# Patient Record
Sex: Male | Born: 1990 | Race: Black or African American | Hispanic: No | Marital: Single | Smoking: Never smoker
Health system: Southern US, Community
[De-identification: ages and names within clinical notes are randomized; demographics above are authoritative.]

---
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
## 2013-02-26 ENCOUNTER — Emergency Department: Payer: Self-pay | Admitting: Emergency Medicine

## 2013-02-26 LAB — COMPREHENSIVE METABOLIC PANEL
Albumin: 4.7 g/dL (ref 3.4–5.0)
Alkaline Phosphatase: 72 U/L (ref 50–136)
BUN: 18 mg/dL (ref 7–18)
Bilirubin,Total: 0.8 mg/dL (ref 0.2–1.0)
Calcium, Total: 9.4 mg/dL (ref 8.5–10.1)
Chloride: 105 mmol/L (ref 98–107)
Co2: 28 mmol/L (ref 21–32)
EGFR (African American): >60
Glucose: 137 mg/dL — ABNORMAL HIGH (ref 65–99)
SGOT(AST): 26 U/L (ref 15–37)
Total Protein: 8.8 g/dL — ABNORMAL HIGH (ref 6.4–8.2)

## 2013-02-26 LAB — CBC
HCT: 48.8 % (ref 40.0–52.0)
MCH: 29.6 pg (ref 26.0–34.0)
MCHC: 32.6 g/dL (ref 32.0–36.0)
MCV: 91 fL (ref 80–100)
RDW: 13.1 % (ref 11.5–14.5)
WBC: 16.5 10*3/uL — ABNORMAL HIGH (ref 3.8–10.6)

## 2013-02-26 LAB — URINALYSIS, COMPLETE
Bacteria: NONE SEEN
Glucose,UR: NEGATIVE mg/dL (ref 0–75)
Leukocyte Esterase: NEGATIVE
Nitrite: NEGATIVE
Ph: 5 (ref 4.5–8.0)
Specific Gravity: 1.033 (ref 1.003–1.030)

## 2014-02-17 ENCOUNTER — Ambulatory Visit: Payer: Self-pay

## 2015-09-22 ENCOUNTER — Encounter: Payer: Self-pay | Admitting: Emergency Medicine

## 2015-09-22 ENCOUNTER — Emergency Department
Admission: EM | Admit: 2015-09-22 | Discharge: 2015-09-22 | Disposition: A | Discharge status: Home / Self Care | Payer: Managed Care, Other (non HMO) | Attending: Emergency Medicine | Admitting: Emergency Medicine

## 2015-09-22 DIAGNOSIS — M79605 Pain in left leg: Secondary | ICD-10-CM | POA: Diagnosis present

## 2015-09-22 DIAGNOSIS — L03116 Cellulitis of left lower limb: Secondary | ICD-10-CM | POA: Diagnosis not present

## 2015-09-22 MED ORDER — TRAMADOL HCL 50 MG PO TABS: 50.0000 mg | ORAL_TABLET | Freq: Four times a day (QID) | ORAL | Status: AC | PRN

## 2015-09-22 MED ORDER — SULFAMETHOXAZOLE-TRIMETHOPRIM 800-160 MG PO TABS: 1.0000 | ORAL_TABLET | Freq: Two times a day (BID) | ORAL | Status: AC

## 2015-09-22 NOTE — ED Notes (Signed)
Presents with possible spider bite to left lower leg

## 2015-09-22 NOTE — ED Provider Notes (Signed)
[facility] Emergency Department Provider Note ____________________________________________  Time seen: Approximately 3:27 PM  I have reviewed the triage vital signs and the nursing notes.   HISTORY  Chief Complaint Insect Bite   HPI James Huber is a 24 y.o. male who presents to the emergency department for evaluation of a possible insect bite to the left lower leg. He noticed it 2-3 days ago and is getting bigger and more painful.  History reviewed. No pertinent past medical history.  There are no active problems to display for this patient.   History reviewed. No pertinent past surgical history.  Current Outpatient Rx  Name  Route  Sig  Dispense  Refill  . sulfamethoxazole-trimethoprim (BACTRIM DS,SEPTRA DS) 800-160 MG tablet   Oral   Take 1 tablet by mouth 2 (two) times daily.   20 tablet   0   . traMADol (ULTRAM) 50 MG tablet   Oral   Take 1 tablet (50 mg total) by mouth every 6 (six) hours as needed.   9 tablet   0     Allergies Review of patient's allergies indicates no known allergies.  No family history on file.  Social History Social History  Substance Use Topics  . Smoking status: Never Smoker   . Smokeless tobacco: None  . Alcohol Use: Yes     Comment: occassional    Review of Systems   Constitutional: No fever/chills Eyes: No visual changes. ENT: No congestion or rhinorrhea Cardiovascular: Denies chest pain. Respiratory: Denies shortness of breath. Gastrointestinal: No abdominal pain.  No nausea, no vomiting.  No diarrhea.  No constipation. Genitourinary: Negative for dysuria. Musculoskeletal: Negative for back pain. Skin: Positive for lesion. Neurological: Negative for headaches, focal weakness or numbness.  10-point ROS otherwise negative.  ____________________________________________   PHYSICAL EXAM:  VITAL SIGNS: ED Triage Vitals  Enc Vitals Group     BP 09/22/15 0724 123/78 mmHg     Pulse Rate  09/22/15 0724 71     Resp 09/22/15 0724 19     Temp 09/22/15 0724 97.6 F (36.4 C)     Temp Source 09/22/15 0724 Oral     SpO2 09/22/15 0724 100 %     Weight 09/22/15 0724 135 lb (61.236 kg)     Height 09/22/15 0724  (1.575 m)     Head Cir --      Peak Flow --      Pain Score 09/22/15 0724 3     Pain Loc --      Pain Edu? --      Excl. in GC? --     Constitutional: Alert and oriented. Well appearing and in no acute distress. Eyes: Conjunctivae are normal. PERRL. EOMI. Head: Atraumatic. Nose: No congestion/rhinnorhea. Mouth/Throat: Mucous membranes are moist.  Oropharynx non-erythematous. No oral lesions. Neck: No stridor. Cardiovascular: Normal rate, regular rhythm.  Good peripheral circulation. Respiratory: Normal respiratory effort.  No retractions. Lungs CTAB. Gastrointestinal: Soft and nontender. No distention. No abdominal bruits.  Musculoskeletal: No lower extremity tenderness nor edema.  No joint effusions. Neurologic:  Normal speech and language. No gross focal neurologic deficits are appreciated. Speech is normal. No gait instability. Skin:  Indurated, non fluctuant, non draining, erythematous, raised area to the left posterior calf; Negative for petechiae.  Psychiatric: Mood and affect are normal. Speech and behavior are normal.  ____________________________________________   LABS (all labs ordered are listed, but only abnormal results are displayed)  Labs Reviewed - No data to display ____________________________________________  EKG   ____________________________________________  RADIOLOGY   ____________________________________________   PROCEDURES  Procedure(s) performed: None ____________________________________________   INITIAL IMPRESSION / ASSESSMENT AND PLAN / ED COURSE  Pertinent labs & imaging results that were available during my care of the patient were reviewed by me and considered in my medical decision making (see chart for  details).  Patient was advised to return to the ER in 2 days if not improving. He was advised to take the antibiotic until finished.  ____________________________________________   FINAL CLINICAL IMPRESSION(S) / ED DIAGNOSES  Final diagnoses:  Cellulitis of left lower extremity       Chinita Pester, FNP 09/22/15 1531  Jene Every, MD 09/22/15 [phone]

## 2015-09-22 NOTE — Discharge Instructions (Signed)

## 2020-06-16 ENCOUNTER — Other Ambulatory Visit: Payer: Self-pay

## 2020-06-16 ENCOUNTER — Emergency Department: Payer: Commercial Managed Care - PPO

## 2020-06-16 ENCOUNTER — Emergency Department
Admission: EM | Admit: 2020-06-16 | Discharge: 2020-06-17 | Disposition: A | Discharge status: Home / Self Care | Payer: Commercial Managed Care - PPO | Attending: Emergency Medicine | Admitting: Emergency Medicine

## 2020-06-16 ENCOUNTER — Encounter: Payer: Self-pay | Admitting: Emergency Medicine

## 2020-06-16 DIAGNOSIS — Y9239 Other specified sports and athletic area as the place of occurrence of the external cause: Secondary | ICD-10-CM | POA: Diagnosis not present

## 2020-06-16 DIAGNOSIS — Y9372 Activity, wrestling: Secondary | ICD-10-CM | POA: Diagnosis not present

## 2020-06-16 DIAGNOSIS — S5001XA Contusion of right elbow, initial encounter: Secondary | ICD-10-CM | POA: Diagnosis not present

## 2020-06-16 DIAGNOSIS — W19XXXA Unspecified fall, initial encounter: Secondary | ICD-10-CM

## 2020-06-16 DIAGNOSIS — S0990XA Unspecified injury of head, initial encounter: Secondary | ICD-10-CM | POA: Diagnosis present

## 2020-06-16 DIAGNOSIS — Y999 Unspecified external cause status: Secondary | ICD-10-CM | POA: Insufficient documentation

## 2020-06-16 DIAGNOSIS — S0101XA Laceration without foreign body of scalp, initial encounter: Secondary | ICD-10-CM | POA: Diagnosis not present

## 2020-06-16 DIAGNOSIS — W1789XA Other fall from one level to another, initial encounter: Secondary | ICD-10-CM | POA: Insufficient documentation

## 2020-06-16 NOTE — ED Notes (Signed)
D/w Dr. Mayford Knife, new orders received for imaging.

## 2020-06-16 NOTE — ED Notes (Signed)
Pt to CT and XR
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
## 2020-06-16 NOTE — ED Provider Notes (Signed)
[facility] Emergency Department Provider Note  ____________________________________________  Time seen: Approximately 11:09 PM  I have reviewed the triage vital signs and the nursing notes.   HISTORY  Chief Complaint Head Injury and Elbow Pain    HPI James Huber is a 29 y.o. male who presents the emergency department complaining of head injury, right elbow injury after a fall tonight.  Patient states that he was training in a wrestling ring.  Patient went to enter the ring.  Patient states that he typically places his hands on the top band, plates into the ring.  Patient's partner typically lowers the top as he enters the ring and attempted to help the patient by doing so.  When the bands swung back, it forced the patient backwards off the side of the ring and into the floor.  Patient landed awkwardly on his head and neck.  No loss of consciousness.  Patient remembers the entire sequence of events.  Patient did sustain a laceration to the left occipital skull.  Slight headache but no visual changes.  No neck pain.  Patient also landed awkwardly on the right elbow but has good range of motion and no loss of sensation to the right hand.  No other injuries or complaints.  No medication prior to arrival.         History reviewed. No pertinent past medical history.  There are no problems to display for this patient.   History reviewed. No pertinent surgical history.  Prior to Admission medications   Medication Sig Start Date End Date Taking? Authorizing Provider  sulfamethoxazole-trimethoprim (BACTRIM DS,SEPTRA DS) 800-160 MG tablet Take 1 tablet by mouth 2 (two) times daily. 09/22/15   Triplett, Cari B, FNP  traMADol (ULTRAM) 50 MG tablet Take 1 tablet (50 mg total) by mouth every 6 (six) hours as needed. 09/22/15   Chinita Pester, FNP    Allergies Patient has no known allergies.  History reviewed. No pertinent family history.  Social History Social  History   Tobacco Use  . Smoking status: Never Smoker  . Smokeless tobacco: Never Used  Vaping Use  . Vaping Use: Never used  Substance Use Topics  . Alcohol use: Yes    Comment: occassional  . Drug use: Not on file     Review of Systems  Constitutional: No fever/chills Eyes: No visual changes. No discharge ENT: No upper respiratory complaints. Cardiovascular: no chest pain. Respiratory: no cough. No SOB. Gastrointestinal: No abdominal pain.  No nausea, no vomiting.  No diarrhea.  No constipation. Musculoskeletal: Elbow injury.  Fall landing on head and neck Skin: Scalp laceration Neurological: Negative for headaches, focal weakness or numbness. 10-point ROS otherwise negative.  ____________________________________________   PHYSICAL EXAM:  VITAL SIGNS: ED Triage Vitals  Enc Vitals Group     BP 06/16/20 2210 (!) 144/78     Pulse Rate 06/16/20 2210 74     Resp 06/16/20 2210 18     Temp 06/16/20 2210 98.6 F (37 C)     Temp Source 06/16/20 2210 Oral     SpO2 06/16/20 2210 100 %     Weight 06/16/20 2211 145 lb (65.8 kg)     Height 06/16/20 2211 5' 2.5" (1.588 m)     Head Circumference --      Peak Flow --      Pain Score 06/16/20 2210 1     Pain Loc --      Pain Edu? --  Excl. in GC? --      Constitutional: Alert and oriented. Well appearing and in no acute distress. Eyes: Conjunctivae are normal. PERRL. EOMI. Head: Superficial laceration noted to the left occipital skull.  This measures approximately 1-1/2 cm.  No active bleeding but palpation reveals hematoma which does leak through this superficial laceration.  Area slightly tender palpation with no underlying palpable abnormality.  No other visible signs of trauma to the head or face.  No other tenderness to palpation.  No raccoon eyes, battle signs, serosanguineous wound drainage from the ears or nares. ENT:      Ears:       Nose: No congestion/rhinnorhea.      Mouth/Throat: Mucous membranes are moist.   Neck: No stridor.  No cervical spine tenderness to palpation  Cardiovascular: Normal rate, regular rhythm. Normal S1 and S2.  Good peripheral circulation. Respiratory: Normal respiratory effort without tachypnea or retractions. Lungs CTAB. Good air entry to the bases with no decreased or absent breath sounds. Musculoskeletal: Full range of motion to all extremities. No gross deformities appreciated.  Visualization of the right elbow reveals no visible signs of trauma.  Good range of motion.  Mild posterior tenderness to palpation with no palpable abnormality.  No ballottement.  Radial pulse and sensation intact distally. Neurologic:  Normal speech and language. No gross focal neurologic deficits are appreciated.  Cranial nerves II through XII grossly intact.  Negative Romberg's and pronator drift. Skin:  Skin is warm, dry and intact. No rash noted. Psychiatric: Mood and affect are normal. Speech and behavior are normal. Patient exhibits appropriate insight and judgement.   ____________________________________________   LABS (all labs ordered are listed, but only abnormal results are displayed)  Labs Reviewed - No data to display ____________________________________________  EKG   ____________________________________________  RADIOLOGY I personally viewed and evaluated these images as part of my medical decision making, as well as reviewing the written report by the radiologist.  DG Elbow Complete Right  Result Date: 06/16/2020 CLINICAL DATA:  Fall with pain and swelling EXAM: RIGHT ELBOW - COMPLETE 3+ VIEW COMPARISON:  None. FINDINGS: There is no evidence of fracture, dislocation, or joint effusion. There is no evidence of arthropathy or other focal bone abnormality. Soft tissues are unremarkable. IMPRESSION: Negative. Electronically Signed   By: Jasmine Pang M.D.   On: 06/16/2020 22:41   CT Head Wo Contrast  Result Date: 06/16/2020 CLINICAL DATA:  Head trauma fell and hit back of  head EXAM: CT HEAD WITHOUT CONTRAST TECHNIQUE: Contiguous axial images were obtained from the base of the skull through the vertex without intravenous contrast. COMPARISON:  Report 10/04/2019 FINDINGS: Brain: No evidence of acute infarction, hemorrhage, hydrocephalus, extra-axial collection or mass lesion/mass effect. Vascular: No hyperdense vessel or unexpected calcification. Skull: Normal. Negative for fracture or focal lesion. Sinuses/Orbits: No acute finding. Other: None IMPRESSION: Negative non contrasted CT appearance of the brain. Electronically Signed   By: Jasmine Pang M.D.   On: 06/16/2020 22:39   CT Cervical Spine Wo Contrast  Result Date: 06/16/2020 CLINICAL DATA:  Larey Seat and hit back of head EXAM: CT CERVICAL SPINE WITHOUT CONTRAST TECHNIQUE: Multidetector CT imaging of the cervical spine was performed without intravenous contrast. Multiplanar CT image reconstructions were also generated. COMPARISON:  None. FINDINGS: Alignment: Straightening of the cervical spine. No subluxation. Facet alignment is normal. Skull base and vertebrae: No acute fracture. No primary bone lesion or focal pathologic process. Soft tissues and spinal canal: No prevertebral fluid or swelling. No visible  canal hematoma. Disc levels:  Within normal limits Upper chest: Negative. Other: None IMPRESSION: Straightening of the cervical spine. No acute osseous abnormality. Electronically Signed   By: Jasmine Pang M.D.   On: 06/16/2020 22:41    ____________________________________________    PROCEDURES  Procedure(s) performed:    Procedures    Medications - No data to display   ____________________________________________   INITIAL IMPRESSION / ASSESSMENT AND PLAN / ED COURSE  Pertinent labs & imaging results that were available during my care of the patient were reviewed by me and considered in my medical decision making (see chart for details).  Review of the [place] CSRS was performed in accordance of the [facility]  prior to dispensing any controlled drugs.           Patient's diagnosis is consistent with fall, superficial laceration of the left occipital scalp, elbow contusion.  Patient presented to emergency department after falling.  Patient landed on his head and neck but imaging is reassuring with no acute osseous or intracranial abnormality.  Imaging of the elbow is unremarkable as well.  Given the superficial nature of the laceration I do not feel that this need closure.  Area is dressed.  Tylenol Motrin for symptom relief at home.  Follow-up primary care as needed.  Patient is given ED precautions to return to the ED for any worsening or new symptoms.     ____________________________________________  FINAL CLINICAL IMPRESSION(S) / ED DIAGNOSES  Final diagnoses:  Fall, initial encounter  Laceration of scalp, initial encounter  Contusion of right elbow, initial encounter      NEW MEDICATIONS STARTED DURING THIS VISIT:  ED Discharge Orders    None          This chart was dictated using voice recognition software/Dragon. Despite best efforts to proofread, errors can occur which can change the meaning. Any change was purely unintentional.    Racheal Patches, PA-C 06/16/20 2341    Sharman Cheek, MD 06/22/20 [phone]

## 2021-10-28 IMAGING — CR DG ELBOW COMPLETE 3+V*R*
4 series · 4 of 4 positions shown · non-contrast
Comparison: None.

CLINICAL DATA: Fall with pain and swelling

EXAM:
RIGHT ELBOW - COMPLETE 3+ VIEW

[elbow ap]
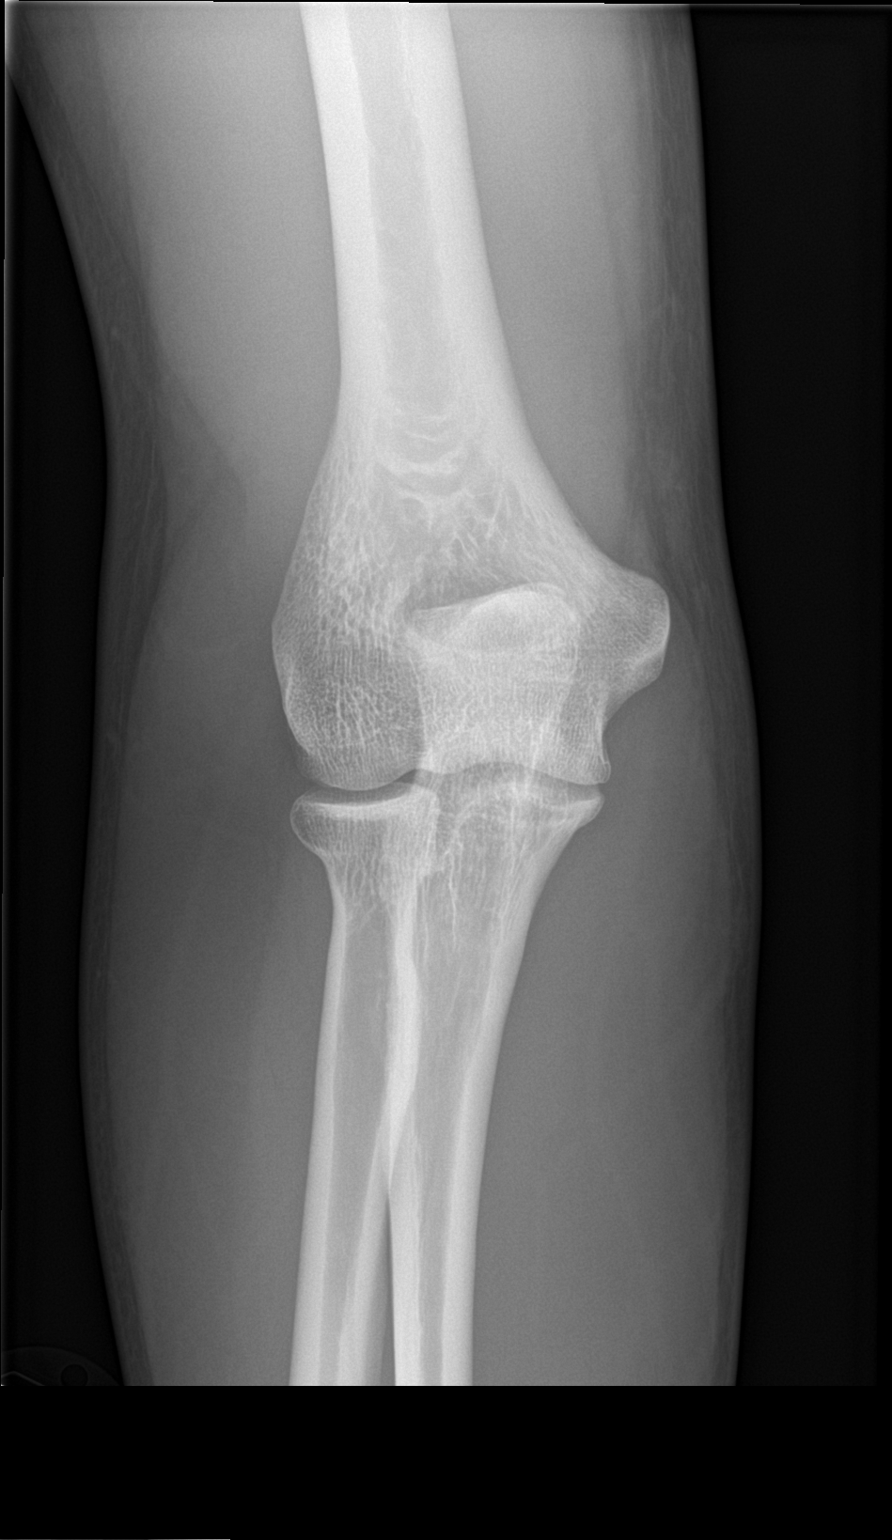

[elbow obl (1 of 2)]
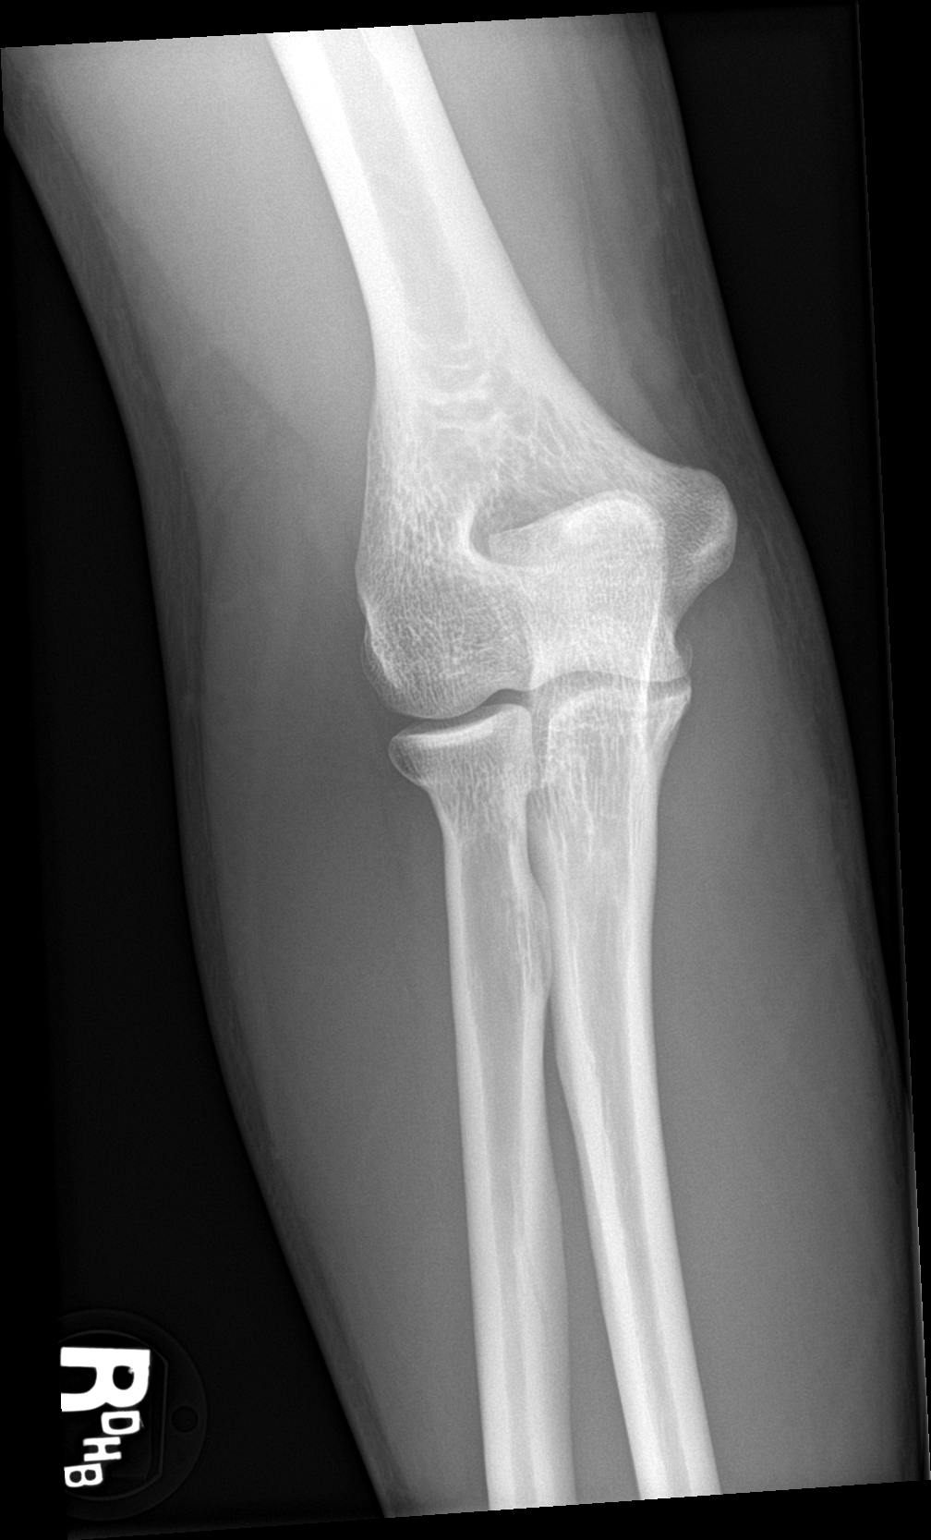

[elbow obl (2 of 2)]
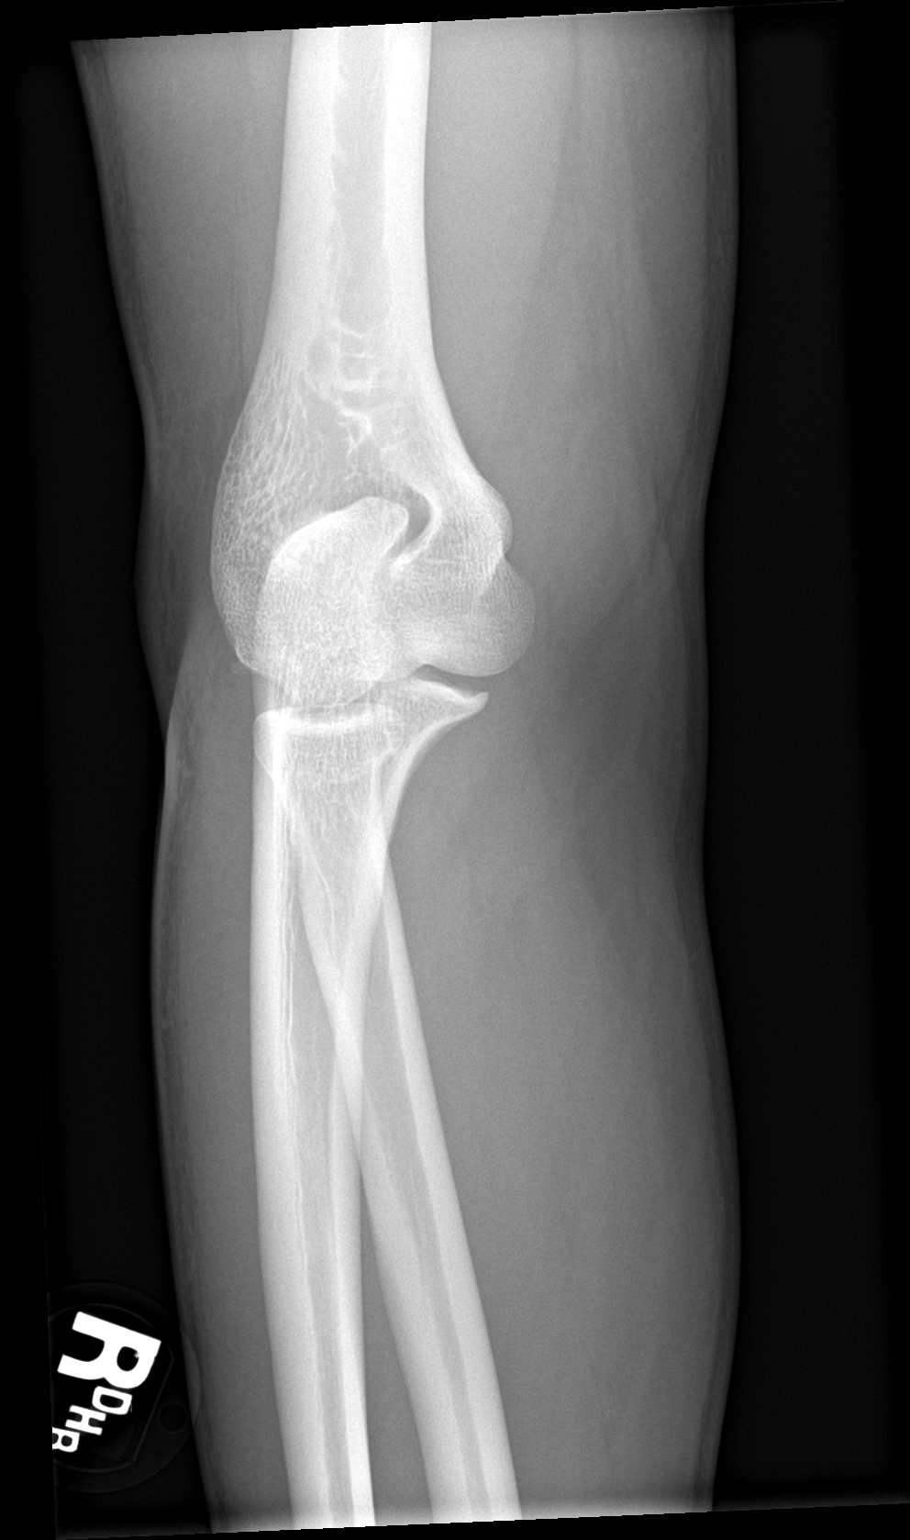

[elbow lat]
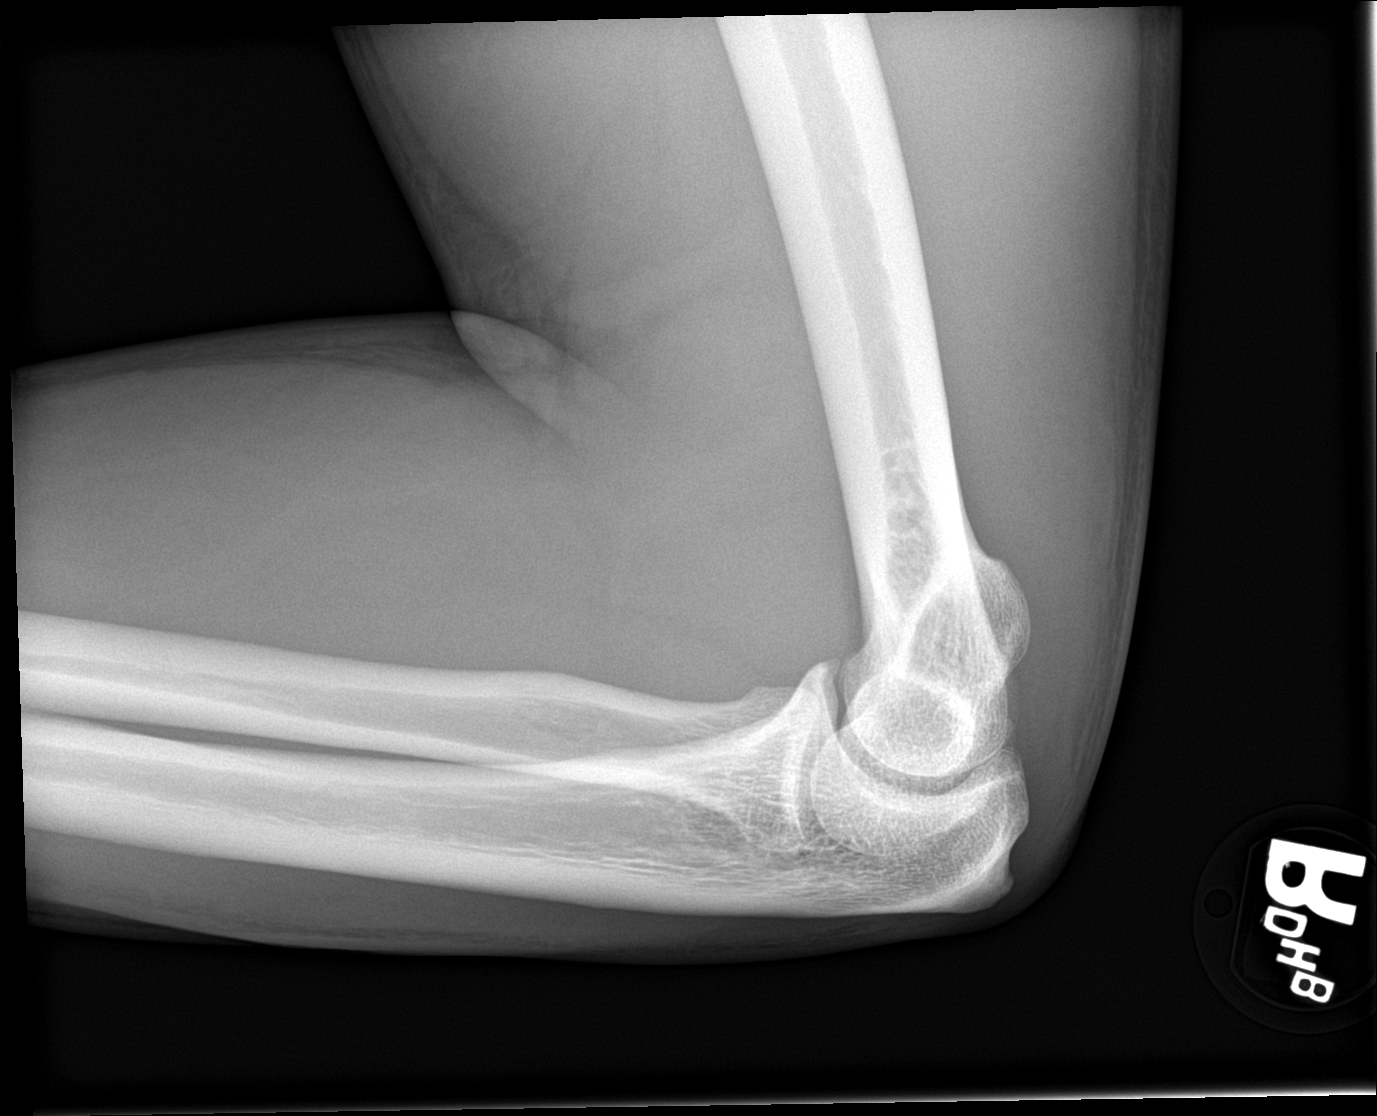

[4 of 4 positions shown; findings below may reference images not displayed]

FINDINGS: There is no evidence of fracture, dislocation, or joint effusion.
There is no evidence of arthropathy or other focal bone abnormality.
Soft tissues are unremarkable.
IMPRESSION: Negative.

## 2021-10-28 IMAGING — CT CT CERVICAL SPINE W/O CM
3 of 4 series · 10 of 33 positions shown, 12 images · non-contrast
Comparison: None.

CLINICAL DATA: Fell and hit back of head

EXAM:
CT CERVICAL SPINE WITHOUT CONTRAST
TECHNIQUE: Multidetector CT imaging of the cervical spine was performed without
intravenous contrast. Multiplanar CT image reconstructions were also
generated.

[Series 4: sagittal bone · sagittal · 0.25mm/px · 5 of 58 slices shown, 6 images]
[im 20/58  bone]
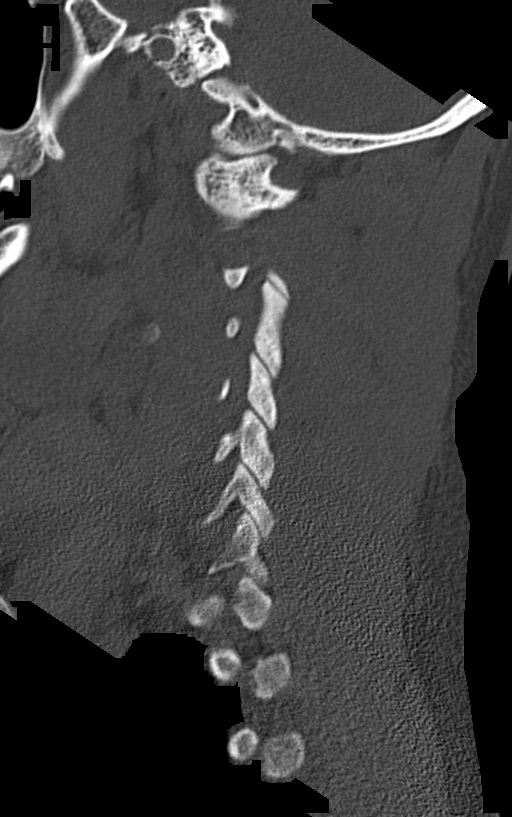
[im 24/58  bone]
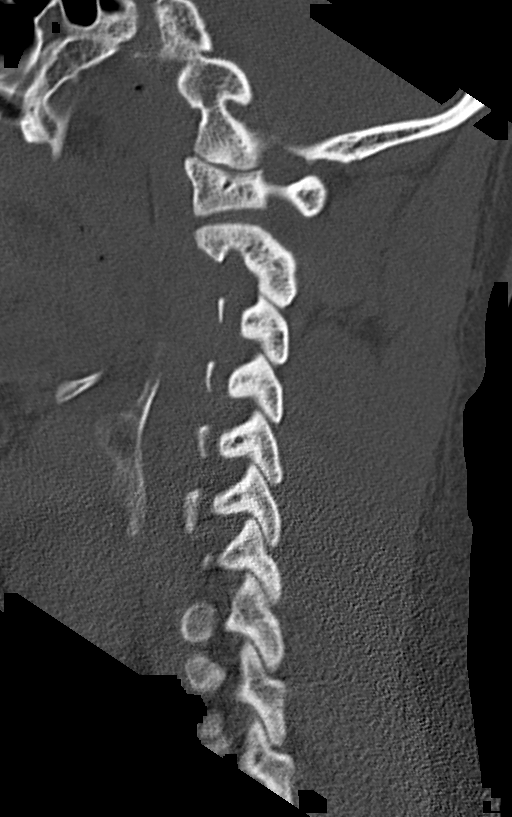
[im 29/58  soft-tissue]
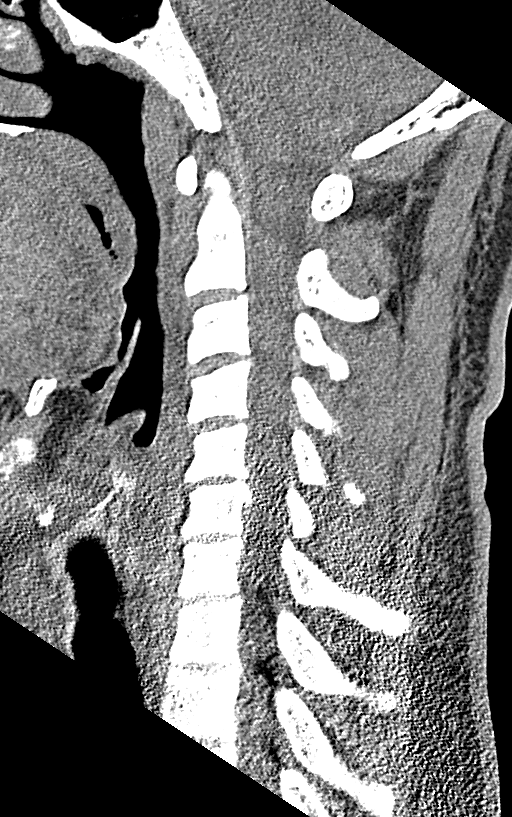
[im 29/58  bone]
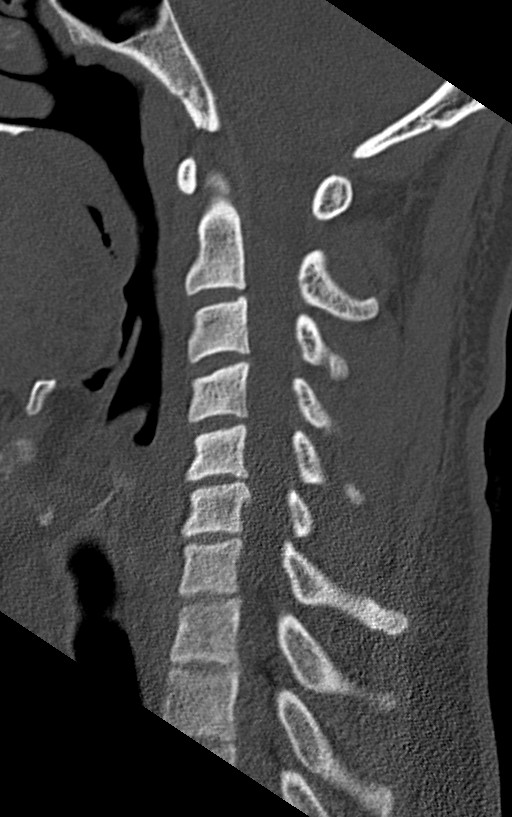
[im 34/58  bone]
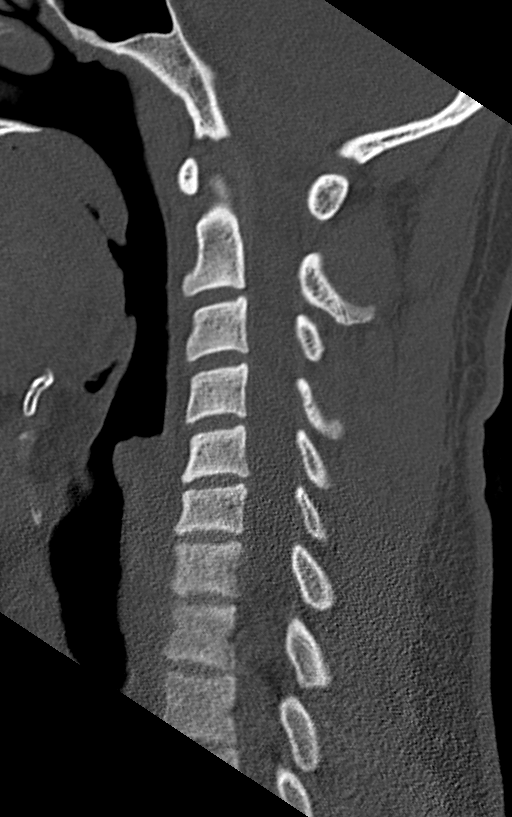
[im 39/58  bone]
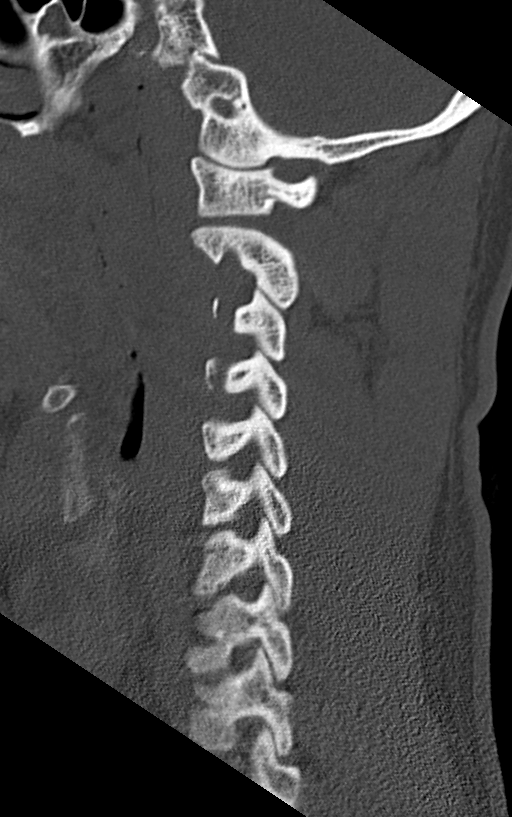

[Series 5: coronal bone · coronal · 0.19mm/px · 3 of 48 slices shown]
[im 10/48  bone]
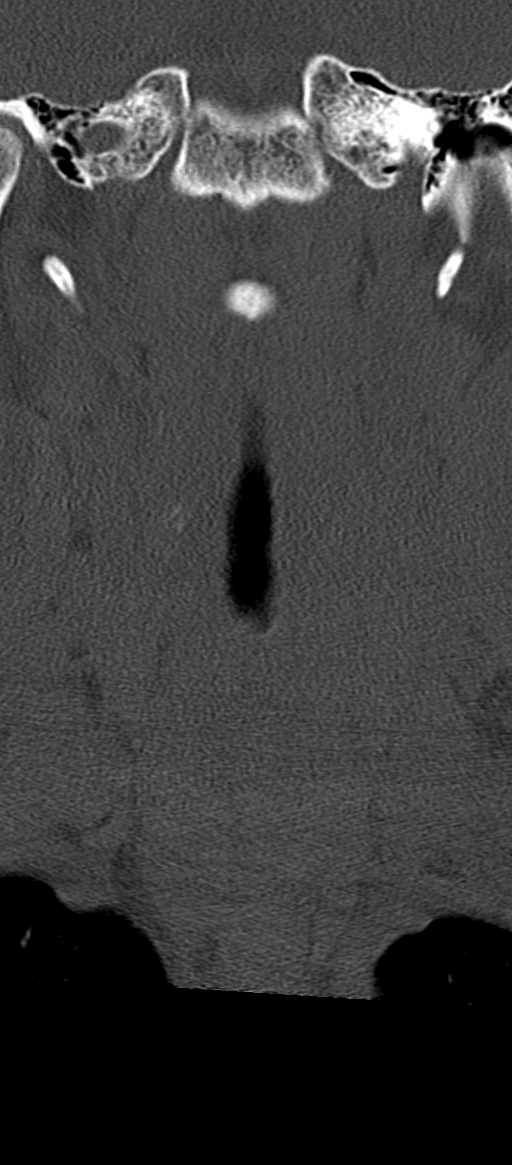
[im 19/48  bone]
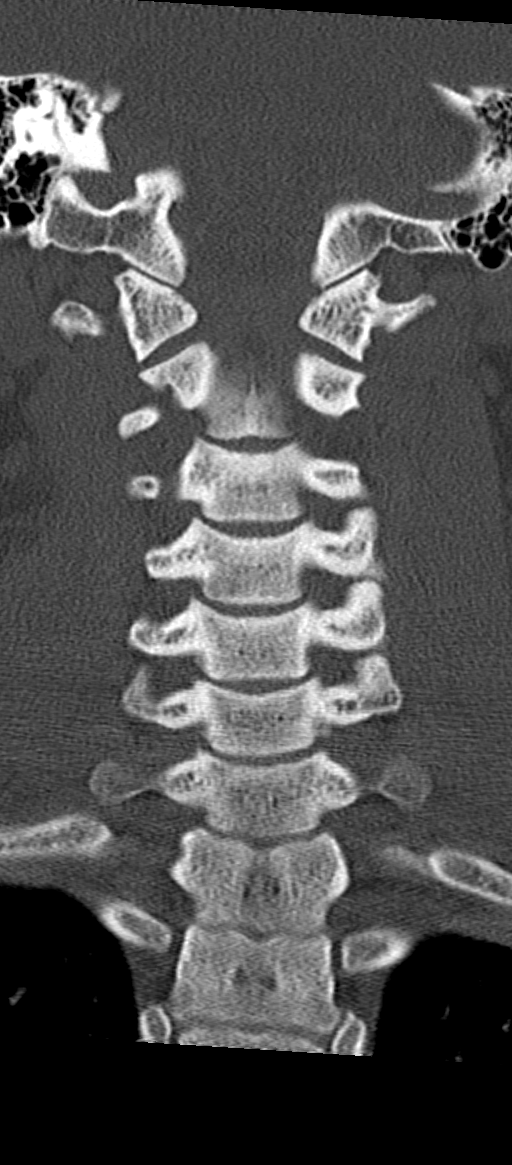
[im 29/48  bone]
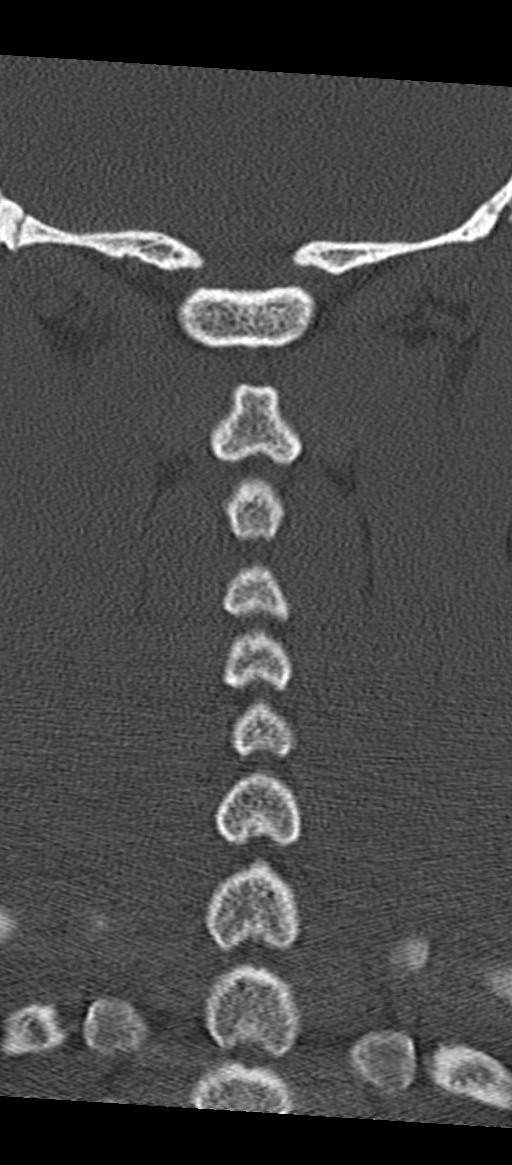

[Series 6: orthogonal bone · axial · 0.22mm/px · z∈[+1561,+1616]mm · 2 of 98 slices shown, 3 images]
[im 33/98  soft-tissue]
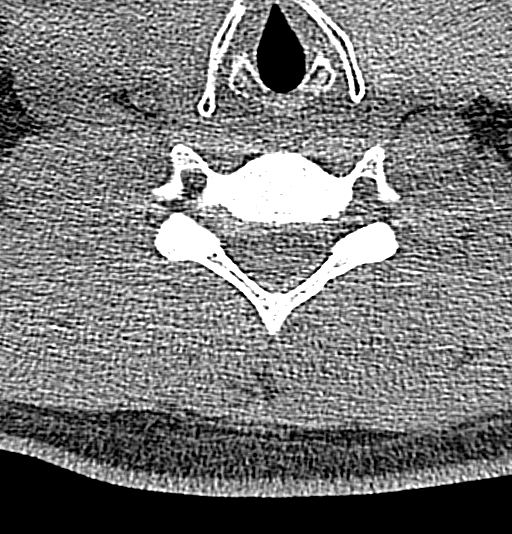
[im 33/98  bone]
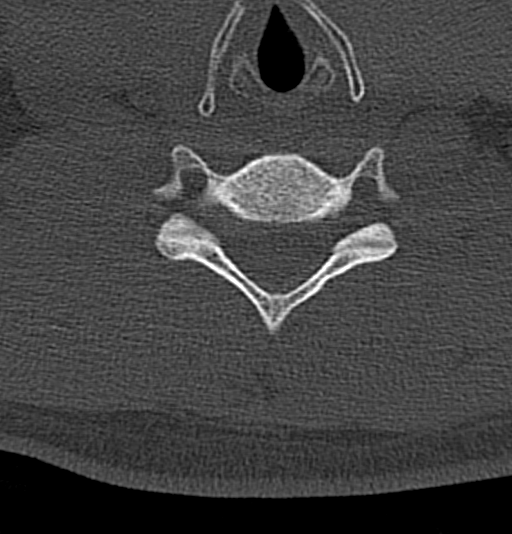
[im 65/98  bone]
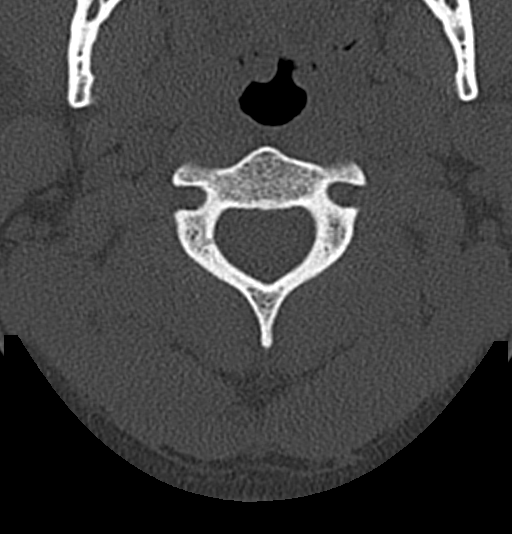

[10 of 33 positions shown; findings below may reference images not displayed]

FINDINGS: Alignment: Straightening of the cervical spine. No subluxation.
Facet alignment is normal.

Skull base and vertebrae: No acute fracture. No primary bone lesion
or focal pathologic process.

Soft tissues and spinal canal: No prevertebral fluid or swelling. No
visible canal hematoma.

Disc levels:  Within normal limits

Upper chest: Negative.

Other: None
IMPRESSION: Straightening of the cervical spine. No acute osseous abnormality.

## 2022-09-07 ENCOUNTER — Emergency Department
Admission: EM | Admit: 2022-09-07 | Discharge: 2022-09-08 | Disposition: A | Discharge status: Home / Self Care | Payer: Commercial Managed Care - PPO | Attending: Emergency Medicine | Admitting: Emergency Medicine

## 2022-09-07 DIAGNOSIS — E876 Hypokalemia: Secondary | ICD-10-CM | POA: Insufficient documentation

## 2022-09-07 DIAGNOSIS — X58XXXA Exposure to other specified factors, initial encounter: Secondary | ICD-10-CM | POA: Insufficient documentation

## 2022-09-07 DIAGNOSIS — R7989 Other specified abnormal findings of blood chemistry: Secondary | ICD-10-CM | POA: Diagnosis not present

## 2022-09-07 DIAGNOSIS — T40725A Adverse effect of synthetic cannabinoids, initial encounter: Secondary | ICD-10-CM | POA: Insufficient documentation

## 2022-09-07 DIAGNOSIS — Z20822 Contact with and (suspected) exposure to covid-19: Secondary | ICD-10-CM | POA: Diagnosis not present

## 2022-09-07 DIAGNOSIS — S60511A Abrasion of right hand, initial encounter: Secondary | ICD-10-CM | POA: Diagnosis not present

## 2022-09-07 DIAGNOSIS — R404 Transient alteration of awareness: Secondary | ICD-10-CM | POA: Diagnosis not present

## 2022-09-07 DIAGNOSIS — T07XXXA Unspecified multiple injuries, initial encounter: Secondary | ICD-10-CM

## 2022-09-07 DIAGNOSIS — S6991XA Unspecified injury of right wrist, hand and finger(s), initial encounter: Secondary | ICD-10-CM | POA: Diagnosis present

## 2022-09-07 NOTE — ED Notes (Signed)
James Huber consult pending /moved to bhu 5

## 2022-09-08 ENCOUNTER — Emergency Department: Payer: Commercial Managed Care - PPO

## 2022-09-08 LAB — COMPREHENSIVE METABOLIC PANEL
ALT: 28 U/L (ref 0–44)
AST: 27 U/L (ref 15–41)
Albumin: 4.3 g/dL (ref 3.5–5.0)
Alkaline Phosphatase: 44 U/L (ref 38–126)
Anion gap: 9 (ref 5–15)
BUN: 23 mg/dL — ABNORMAL HIGH (ref 6–20)
CO2: 25 mmol/L (ref 22–32)
Calcium: 8.9 mg/dL (ref 8.9–10.3)
Chloride: 106 mmol/L (ref 98–111)
Creatinine, Ser: 1.33 mg/dL — ABNORMAL HIGH (ref 0.61–1.24)
GFR, Estimated: >60 mL/min (ref >60)
Glucose, Bld: 198 mg/dL — ABNORMAL HIGH (ref 70–99)
Potassium: 3 mmol/L — ABNORMAL LOW (ref 3.5–5.1)
Sodium: 140 mmol/L (ref 135–145)
Total Bilirubin: 0.7 mg/dL (ref 0.3–1.2)
Total Protein: 7.3 g/dL (ref 6.5–8.1)

## 2022-09-08 LAB — CBC WITH DIFFERENTIAL/PLATELET
Abs Immature Granulocytes: 0.06 10*3/uL (ref 0.00–0.07)
Basophils Absolute: 0.1 10*3/uL (ref 0.0–0.1)
Basophils Relative: 1 %
Eosinophils Absolute: 0.2 10*3/uL (ref 0.0–0.5)
Eosinophils Relative: 2 %
HCT: 42 % (ref 39.0–52.0)
Hemoglobin: 14.3 g/dL (ref 13.0–17.0)
Immature Granulocytes: 1 %
Lymphocytes Relative: 30 %
Lymphs Abs: 3 10*3/uL (ref 0.7–4.0)
MCH: 30.6 pg (ref 26.0–34.0)
MCHC: 34 g/dL (ref 30.0–36.0)
MCV: 89.7 fL (ref 80.0–100.0)
Monocytes Absolute: 0.9 10*3/uL (ref 0.1–1.0)
Monocytes Relative: 8 %
Neutro Abs: 6 10*3/uL (ref 1.7–7.7)
Neutrophils Relative %: 58 %
Platelets: 220 10*3/uL (ref 150–400)
RBC: 4.68 MIL/uL (ref 4.22–5.81)
RDW: 12.3 % (ref 11.5–15.5)
WBC: 10.2 10*3/uL (ref 4.0–10.5)
nRBC: 0 % (ref 0.0–0.2)

## 2022-09-08 LAB — CK
Total CK: 404 U/L — ABNORMAL HIGH (ref 49–397)
Total CK: 405 U/L — ABNORMAL HIGH (ref 49–397)

## 2022-09-08 LAB — URINE DRUG SCREEN, QUALITATIVE (ARMC ONLY)
Amphetamines, Ur Screen: NOT DETECTED
Barbiturates, Ur Screen: NOT DETECTED
Benzodiazepine, Ur Scrn: POSITIVE — AB
Cannabinoid 50 Ng, Ur ~~LOC~~: POSITIVE — AB
Cocaine Metabolite,Ur ~~LOC~~: NOT DETECTED
MDMA (Ecstasy)Ur Screen: NOT DETECTED
Methadone Scn, Ur: NOT DETECTED
Opiate, Ur Screen: NOT DETECTED
Phencyclidine (PCP) Ur S: NOT DETECTED
Tricyclic, Ur Screen: NOT DETECTED

## 2022-09-08 LAB — SARS CORONAVIRUS 2 BY RT PCR: SARS Coronavirus 2 by RT PCR: NEGATIVE

## 2022-09-08 LAB — SALICYLATE LEVEL: Salicylate Lvl: <7 mg/dL — ABNORMAL LOW (ref 7.0–30.0)

## 2022-09-08 LAB — ACETAMINOPHEN LEVEL: Acetaminophen (Tylenol), Serum: <10 ug/mL — ABNORMAL LOW (ref 10–30)

## 2022-09-08 LAB — ETHANOL: Alcohol, Ethyl (B): <10 mg/dL (ref <10)

## 2022-09-08 MED ORDER — LACTATED RINGERS IV BOLUS: 1000.0000 mL | Freq: Once | INTRAVENOUS | Status: DC

## 2022-09-08 MED ORDER — SODIUM CHLORIDE 0.9 % IV BOLUS
1000.0000 mL | Freq: Once | INTRAVENOUS | Status: AC
  Administered 2022-09-08: 1000 mL via INTRAVENOUS

## 2022-09-08 MED ORDER — POTASSIUM CHLORIDE 10 MEQ/100ML IV SOLN
10.0000 meq | Freq: Once | INTRAVENOUS | Status: AC
  Administered 2022-09-08: 10 meq via INTRAVENOUS
  Filled 2022-09-08: qty 100

## 2022-09-08 MED ORDER — POTASSIUM CHLORIDE CRYS ER 20 MEQ PO TBCR
40.0000 meq | EXTENDED_RELEASE_TABLET | Freq: Once | ORAL | Status: AC
  Administered 2022-09-08: 40 meq via ORAL
  Filled 2022-09-08: qty 2

## 2022-09-08 NOTE — ED Notes (Signed)
Pt awake and able to ambulate on own. Requests to go home. Pt attempting to find ride and Dr. Karma Greaser just spoke with pt

## 2022-09-08 NOTE — Discharge Instructions (Addendum)
Please avoid THC or gummy use in the future as it seems to have a bad reaction for you.  Keep your wounds clean and dry.  Contact [facility] to set up a follow-up appointment with a new primary care provider.  Return to the emergency department if you develop new or worsening symptoms that concern you.

## 2022-09-08 NOTE — ED Notes (Signed)
Pt is on monitor while receiving K via IV
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
## 2022-09-08 NOTE — ED Notes (Signed)
Pt grandfather is on way to pick him up and take him home.
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
## 2022-09-08 NOTE — ED Notes (Signed)
Pt more awake at this time, does not remember events before arriving to ED. Pt educated on events. Pt calm cooperative.

## 2024-09-21 ENCOUNTER — Emergency Department
Admission: EM | Admit: 2024-09-21 | Discharge: 2024-09-21 | Disposition: A | Discharge status: Home / Self Care | Attending: Emergency Medicine | Admitting: Emergency Medicine

## 2024-09-21 ENCOUNTER — Emergency Department

## 2024-09-21 ENCOUNTER — Other Ambulatory Visit: Payer: Self-pay

## 2024-09-21 DIAGNOSIS — W208XXA Other cause of strike by thrown, projected or falling object, initial encounter: Secondary | ICD-10-CM | POA: Diagnosis not present

## 2024-09-21 DIAGNOSIS — S61412A Laceration without foreign body of left hand, initial encounter: Secondary | ICD-10-CM | POA: Insufficient documentation

## 2024-09-21 DIAGNOSIS — S6992XA Unspecified injury of left wrist, hand and finger(s), initial encounter: Secondary | ICD-10-CM | POA: Diagnosis present

## 2024-09-21 DIAGNOSIS — Z23 Encounter for immunization: Secondary | ICD-10-CM | POA: Insufficient documentation

## 2024-09-21 MED ORDER — TETANUS-DIPHTH-ACELL PERTUSSIS 5-2-15.5 LF-MCG/0.5 IM SUSP
0.5000 mL | Freq: Once | INTRAMUSCULAR | Status: AC
  Administered 2024-09-21: 0.5 mL via INTRAMUSCULAR
  Filled 2024-09-21: qty 0.5

## 2024-09-21 MED ORDER — LIDOCAINE HCL (PF) 1 % IJ SOLN
5.0000 mL | Freq: Once | INTRAMUSCULAR | Status: AC
  Administered 2024-09-21: 5 mL
  Filled 2024-09-21: qty 5

## 2024-09-21 NOTE — Discharge Instructions (Addendum)
 You have been diagnosed with laceration of the left hand.  Please do not submerge your hand in water.  Please go to urgent care or come back to ED for suture removal in 6 days.  Please check for signs of infection.  Please come back to ED or go to your PCP if you have any symptoms or symptoms worsen.  You can take ibuprofen every 8 hours as needed for pain.

## 2024-09-21 NOTE — ED Notes (Signed)
 Bandage applied to pts left hand.
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
